# Patient Record
Sex: Female | Born: 1940 | Race: Black or African American | Hispanic: No | Marital: Married | Smoking: Never smoker
Health system: Southern US, Community
[De-identification: ages and names within clinical notes are randomized; demographics above are authoritative.]

## PROBLEM LIST (undated history)

## (undated) DIAGNOSIS — E119 Type 2 diabetes mellitus without complications: Secondary | ICD-10-CM

## (undated) DIAGNOSIS — I10 Essential (primary) hypertension: Secondary | ICD-10-CM

## (undated) HISTORY — PX: NO PAST SURGERIES: SHX2092

---

## 1999-01-03 ENCOUNTER — Other Ambulatory Visit: Admission: RE | Admit: 1999-01-03 | Discharge: 1999-01-03 | Payer: Self-pay | Admitting: Rheumatology

## 1999-01-24 ENCOUNTER — Other Ambulatory Visit: Admission: RE | Admit: 1999-01-24 | Discharge: 1999-01-24 | Payer: Self-pay | Admitting: Rheumatology

## 2000-11-15 ENCOUNTER — Other Ambulatory Visit: Admission: RE | Admit: 2000-11-15 | Discharge: 2000-11-15 | Payer: Self-pay | Admitting: Rheumatology

## 2002-01-17 ENCOUNTER — Other Ambulatory Visit: Admission: RE | Admit: 2002-01-17 | Discharge: 2002-01-17 | Payer: Self-pay | Admitting: Rheumatology

## 2002-08-17 ENCOUNTER — Encounter: Payer: Self-pay | Admitting: Emergency Medicine

## 2002-08-17 ENCOUNTER — Encounter: Payer: Self-pay | Admitting: Internal Medicine

## 2002-08-17 ENCOUNTER — Emergency Department (HOSPITAL_COMMUNITY): Admission: EM | Admit: 2002-08-17 | Discharge: 2002-08-17 | Payer: Self-pay | Admitting: Emergency Medicine

## 2003-09-07 ENCOUNTER — Other Ambulatory Visit: Admission: RE | Admit: 2003-09-07 | Discharge: 2003-09-07 | Payer: Self-pay | Admitting: Rheumatology

## 2008-04-06 ENCOUNTER — Other Ambulatory Visit: Admission: RE | Admit: 2008-04-06 | Discharge: 2008-04-06 | Payer: Self-pay | Admitting: Obstetrics and Gynecology

## 2010-06-07 ENCOUNTER — Encounter
Admission: RE | Admit: 2010-06-07 | Discharge: 2010-06-07 | Payer: Self-pay | Source: Home / Self Care | Attending: Internal Medicine | Admitting: Internal Medicine

## 2010-07-03 ENCOUNTER — Encounter: Payer: Self-pay | Admitting: Internal Medicine

## 2010-10-28 NOTE — Consult Note (Signed)
NAME:  Lori Mendez, Lori Mendez                         ACCOUNT NO.:  [id number]   MEDICAL RECORD NO.:  [id number]                   PATIENT TYPE:  EMS   LOCATION:  [facility]                                 FACILITY:  [facility]   PHYSICIAN:  Candyce Churn, M.D.          DATE OF BIRTH:  [birth year]   DATE OF CONSULTATION:  DATE OF DISCHARGE:                                   CONSULTATION   FINDINGS:  1. Left lower abdominal pain times five days.  2. Bilateral enlarged ovaries with right hydrosalpinx with both ovaries     looking abnormal. Uterine fibroids noted as well.  3. Obstipation in the right colon noted.  4. History of hypertension.  5. History of type II diabetes mellitus.  6. Mild to moderate obesity.   HISTORY OF PRESENT ILLNESS:  The patient was in her usual state of health,  feeling well until about five days ago and developed some crampy left flank  pain that radiated to the left lower quadrant. This became relatively  intense last evening and she came to the [facility] emergency room  this morning for evaluation. After a fairly extensive evaluation including  abdominal ultrasound and abdominal CT, the only findings are the above  findings on CT scan and ultrasound.   LABORATORY DATA:  White count 4,800. Hemoglobin 12.4, platelet count  271,000. Differential not performed. Electrolytes revealed sodium of 132,  potassium 3.6, chloride 102, bicarb 31, BUN 15, creatinine 0.9, blood sugar  197. UA was clear with specific gravity of 1.009, pH of 6.0, 0-2 white  cells. Otherwise negative.   DIAGNOSTIC IMPRESSION:  Chest x-ray was normal.   HOSPITAL COURSE:  The patient was actually given some Morphine sulfate at  0830 a.m. for pain and her pain has resolved dramatically. She is still  having some mild crampy pain that is somewhat positional in nature with  sitting up and standing up.   PHYSICAL EXAMINATION:  VITAL SIGNS: Blood pressure initially of 190/92 but  it came  down to 128/72. Pulse 68 and regular. Respiratory rate 20.  Temperature 97 orally. O2 sat is 97%.  HEENT: Benign examination.  NECK: Supple. No jugular venous distention.  CHEST: Clear to auscultation and percussion.  CARDIAC: Regular rate and rhythm.  No murmur, rub, or gallop.  ABDOMEN: Soft but mildly distended. Bowel sounds are present and slightly  decreased. There is no rebound tenderness. She had some mild tenderness in  the left lower quadrant to palpation.   ASSESSMENT:  Left lower quadrant pain which may or may not be related to her  left ovarian enlargement. She also has a right hydrosalpinx which apparently  is asymptomatic. She is obstipated in her right colon but the transverse  colon and descending colon, other than having non-specific bowel gas, are  normal.   PLAN:  This case was discussed with Dr. Katherine Roan who agrees that her  pain could be  emanating from the ovarian changes and agrees to see her in  his office tomorrow. We will discharge her on Darvocet 1-2 every four to six  hours as needed and for any nausea, Phenergan 12.5 mg every six hours as  needed. Any acute decline in her functional ability, she should contact us  immediately. I have recommended a bland diet and to hydrate herself  carefully. She should not take large boluses of fluids at any one time. She  should resume her usual medications as an outpatient.   MEDICATIONS:  Include Glucotrol XL, one baby aspirin daily, and two anti-  hypertensive medications that she does not have with her, that she does not  recall the names of.                                               Candyce Churn, M.D.    RNG/MEDQ  D:  08/17/2002  T:  08/17/2002  Job:  [id number]   cc:   Demetria Pore. Coral Spikes, M.D.  [address]  [address]  [place]  [place] [address]  Fax: [phone]   S. Kyra Manges, M.D.  [phone] [address]  [place]  [place] [address]  Fax: [phone]

## 2011-09-25 DIAGNOSIS — E785 Hyperlipidemia, unspecified: Secondary | ICD-10-CM | POA: Diagnosis not present

## 2011-09-25 DIAGNOSIS — I10 Essential (primary) hypertension: Secondary | ICD-10-CM | POA: Diagnosis not present

## 2011-09-25 DIAGNOSIS — R05 Cough: Secondary | ICD-10-CM | POA: Diagnosis not present

## 2012-01-22 DIAGNOSIS — I10 Essential (primary) hypertension: Secondary | ICD-10-CM | POA: Diagnosis not present

## 2012-01-22 DIAGNOSIS — E785 Hyperlipidemia, unspecified: Secondary | ICD-10-CM | POA: Diagnosis not present

## 2012-05-20 DIAGNOSIS — E785 Hyperlipidemia, unspecified: Secondary | ICD-10-CM | POA: Diagnosis not present

## 2012-05-20 DIAGNOSIS — I10 Essential (primary) hypertension: Secondary | ICD-10-CM | POA: Diagnosis not present

## 2012-05-20 DIAGNOSIS — E119 Type 2 diabetes mellitus without complications: Secondary | ICD-10-CM | POA: Diagnosis not present

## 2012-09-26 DIAGNOSIS — I10 Essential (primary) hypertension: Secondary | ICD-10-CM | POA: Diagnosis not present

## 2012-09-26 DIAGNOSIS — E1129 Type 2 diabetes mellitus with other diabetic kidney complication: Secondary | ICD-10-CM | POA: Diagnosis not present

## 2012-09-26 DIAGNOSIS — E663 Overweight: Secondary | ICD-10-CM | POA: Diagnosis not present

## 2012-09-26 DIAGNOSIS — E782 Mixed hyperlipidemia: Secondary | ICD-10-CM | POA: Diagnosis not present

## 2012-09-26 DIAGNOSIS — R809 Proteinuria, unspecified: Secondary | ICD-10-CM | POA: Diagnosis not present

## 2012-12-02 DIAGNOSIS — E1139 Type 2 diabetes mellitus with other diabetic ophthalmic complication: Secondary | ICD-10-CM | POA: Diagnosis not present

## 2012-12-02 DIAGNOSIS — E11329 Type 2 diabetes mellitus with mild nonproliferative diabetic retinopathy without macular edema: Secondary | ICD-10-CM | POA: Diagnosis not present

## 2012-12-02 DIAGNOSIS — H2510 Age-related nuclear cataract, unspecified eye: Secondary | ICD-10-CM | POA: Diagnosis not present

## 2013-01-31 DIAGNOSIS — I10 Essential (primary) hypertension: Secondary | ICD-10-CM | POA: Diagnosis not present

## 2013-01-31 DIAGNOSIS — E785 Hyperlipidemia, unspecified: Secondary | ICD-10-CM | POA: Diagnosis not present

## 2013-01-31 DIAGNOSIS — Z0000 Encounter for general adult medical examination without abnormal findings: Secondary | ICD-10-CM | POA: Diagnosis not present

## 2013-01-31 DIAGNOSIS — E1139 Type 2 diabetes mellitus with other diabetic ophthalmic complication: Secondary | ICD-10-CM | POA: Diagnosis not present

## 2013-01-31 DIAGNOSIS — E1339 Other specified diabetes mellitus with other diabetic ophthalmic complication: Secondary | ICD-10-CM | POA: Diagnosis not present

## 2013-03-26 DIAGNOSIS — Z23 Encounter for immunization: Secondary | ICD-10-CM | POA: Diagnosis not present

## 2013-05-02 ENCOUNTER — Other Ambulatory Visit: Payer: Self-pay

## 2013-05-02 DIAGNOSIS — Z1231 Encounter for screening mammogram for malignant neoplasm of breast: Secondary | ICD-10-CM

## 2013-06-03 ENCOUNTER — Ambulatory Visit
Admission: RE | Admit: 2013-06-03 | Discharge: 2013-06-03 | Disposition: A | Discharge status: Home / Self Care | Payer: BC Managed Care – PPO | Source: Ambulatory Visit

## 2013-06-03 DIAGNOSIS — Z1231 Encounter for screening mammogram for malignant neoplasm of breast: Secondary | ICD-10-CM

## 2013-06-10 DIAGNOSIS — E785 Hyperlipidemia, unspecified: Secondary | ICD-10-CM | POA: Diagnosis not present

## 2013-06-10 DIAGNOSIS — J069 Acute upper respiratory infection, unspecified: Secondary | ICD-10-CM | POA: Diagnosis not present

## 2013-06-10 DIAGNOSIS — E119 Type 2 diabetes mellitus without complications: Secondary | ICD-10-CM | POA: Diagnosis not present

## 2013-06-10 DIAGNOSIS — I10 Essential (primary) hypertension: Secondary | ICD-10-CM | POA: Diagnosis not present

## 2013-06-16 DIAGNOSIS — H4011X0 Primary open-angle glaucoma, stage unspecified: Secondary | ICD-10-CM | POA: Diagnosis not present

## 2013-06-16 DIAGNOSIS — E119 Type 2 diabetes mellitus without complications: Secondary | ICD-10-CM | POA: Diagnosis not present

## 2013-06-16 DIAGNOSIS — H2510 Age-related nuclear cataract, unspecified eye: Secondary | ICD-10-CM | POA: Diagnosis not present

## 2013-06-16 DIAGNOSIS — H409 Unspecified glaucoma: Secondary | ICD-10-CM | POA: Diagnosis not present

## 2013-07-30 DIAGNOSIS — H40019 Open angle with borderline findings, low risk, unspecified eye: Secondary | ICD-10-CM | POA: Diagnosis not present

## 2013-09-17 DIAGNOSIS — E1139 Type 2 diabetes mellitus with other diabetic ophthalmic complication: Secondary | ICD-10-CM | POA: Diagnosis not present

## 2013-09-17 DIAGNOSIS — E1339 Other specified diabetes mellitus with other diabetic ophthalmic complication: Secondary | ICD-10-CM | POA: Diagnosis not present

## 2013-09-17 DIAGNOSIS — IMO0001 Reserved for inherently not codable concepts without codable children: Secondary | ICD-10-CM | POA: Diagnosis not present

## 2013-09-17 DIAGNOSIS — I10 Essential (primary) hypertension: Secondary | ICD-10-CM | POA: Diagnosis not present

## 2013-09-17 DIAGNOSIS — E782 Mixed hyperlipidemia: Secondary | ICD-10-CM | POA: Diagnosis not present

## 2013-09-18 DIAGNOSIS — H40019 Open angle with borderline findings, low risk, unspecified eye: Secondary | ICD-10-CM | POA: Diagnosis not present

## 2013-12-03 DIAGNOSIS — H4011X0 Primary open-angle glaucoma, stage unspecified: Secondary | ICD-10-CM | POA: Diagnosis not present

## 2013-12-03 DIAGNOSIS — H409 Unspecified glaucoma: Secondary | ICD-10-CM | POA: Diagnosis not present

## 2013-12-03 DIAGNOSIS — H2510 Age-related nuclear cataract, unspecified eye: Secondary | ICD-10-CM | POA: Diagnosis not present

## 2013-12-08 DIAGNOSIS — H409 Unspecified glaucoma: Secondary | ICD-10-CM | POA: Diagnosis not present

## 2013-12-08 DIAGNOSIS — H4011X0 Primary open-angle glaucoma, stage unspecified: Secondary | ICD-10-CM | POA: Diagnosis not present

## 2014-02-02 DIAGNOSIS — Z0000 Encounter for general adult medical examination without abnormal findings: Secondary | ICD-10-CM | POA: Diagnosis not present

## 2014-02-02 DIAGNOSIS — E785 Hyperlipidemia, unspecified: Secondary | ICD-10-CM | POA: Diagnosis not present

## 2014-02-02 DIAGNOSIS — Z23 Encounter for immunization: Secondary | ICD-10-CM | POA: Diagnosis not present

## 2014-02-02 DIAGNOSIS — E663 Overweight: Secondary | ICD-10-CM | POA: Diagnosis not present

## 2014-02-02 DIAGNOSIS — I10 Essential (primary) hypertension: Secondary | ICD-10-CM | POA: Diagnosis not present

## 2014-02-02 DIAGNOSIS — Z6837 Body mass index (BMI) 37.0-37.9, adult: Secondary | ICD-10-CM | POA: Diagnosis not present

## 2014-02-02 DIAGNOSIS — IMO0001 Reserved for inherently not codable concepts without codable children: Secondary | ICD-10-CM | POA: Diagnosis not present

## 2014-06-03 DIAGNOSIS — I10 Essential (primary) hypertension: Secondary | ICD-10-CM | POA: Diagnosis not present

## 2014-06-03 DIAGNOSIS — E11319 Type 2 diabetes mellitus with unspecified diabetic retinopathy without macular edema: Secondary | ICD-10-CM | POA: Diagnosis not present

## 2014-06-03 DIAGNOSIS — E785 Hyperlipidemia, unspecified: Secondary | ICD-10-CM | POA: Diagnosis not present

## 2014-06-03 DIAGNOSIS — R809 Proteinuria, unspecified: Secondary | ICD-10-CM | POA: Diagnosis not present

## 2014-06-03 DIAGNOSIS — Z6838 Body mass index (BMI) 38.0-38.9, adult: Secondary | ICD-10-CM | POA: Diagnosis not present

## 2014-06-03 DIAGNOSIS — E663 Overweight: Secondary | ICD-10-CM | POA: Diagnosis not present

## 2014-06-03 DIAGNOSIS — E1329 Other specified diabetes mellitus with other diabetic kidney complication: Secondary | ICD-10-CM | POA: Diagnosis not present

## 2014-06-11 ENCOUNTER — Other Ambulatory Visit: Payer: Self-pay | Admitting: Gastroenterology

## 2014-09-07 ENCOUNTER — Ambulatory Visit (HOSPITAL_COMMUNITY): Admission: RE | Admit: 2014-09-07 | Payer: Medicare Other | Source: Ambulatory Visit | Admitting: Gastroenterology

## 2014-09-07 ENCOUNTER — Encounter (HOSPITAL_COMMUNITY): Admission: RE | Payer: Self-pay | Source: Ambulatory Visit

## 2014-09-07 SURGERY — COLONOSCOPY WITH PROPOFOL
Anesthesia: Monitor Anesthesia Care

## 2014-09-08 DIAGNOSIS — H2513 Age-related nuclear cataract, bilateral: Secondary | ICD-10-CM | POA: Diagnosis not present

## 2014-09-08 DIAGNOSIS — H4011X3 Primary open-angle glaucoma, severe stage: Secondary | ICD-10-CM | POA: Diagnosis not present

## 2014-09-30 DIAGNOSIS — R51 Headache: Secondary | ICD-10-CM | POA: Diagnosis not present

## 2014-09-30 DIAGNOSIS — E785 Hyperlipidemia, unspecified: Secondary | ICD-10-CM | POA: Diagnosis not present

## 2014-09-30 DIAGNOSIS — E11319 Type 2 diabetes mellitus with unspecified diabetic retinopathy without macular edema: Secondary | ICD-10-CM | POA: Diagnosis not present

## 2014-09-30 DIAGNOSIS — I10 Essential (primary) hypertension: Secondary | ICD-10-CM | POA: Diagnosis not present

## 2014-12-24 DIAGNOSIS — H2513 Age-related nuclear cataract, bilateral: Secondary | ICD-10-CM | POA: Diagnosis not present

## 2014-12-24 DIAGNOSIS — H4011X3 Primary open-angle glaucoma, severe stage: Secondary | ICD-10-CM | POA: Diagnosis not present

## 2015-01-18 DIAGNOSIS — H4011X3 Primary open-angle glaucoma, severe stage: Secondary | ICD-10-CM | POA: Diagnosis not present

## 2015-01-18 DIAGNOSIS — H2513 Age-related nuclear cataract, bilateral: Secondary | ICD-10-CM | POA: Diagnosis not present

## 2015-02-19 DIAGNOSIS — I10 Essential (primary) hypertension: Secondary | ICD-10-CM | POA: Diagnosis not present

## 2015-02-19 DIAGNOSIS — E11319 Type 2 diabetes mellitus with unspecified diabetic retinopathy without macular edema: Secondary | ICD-10-CM | POA: Diagnosis not present

## 2015-02-19 DIAGNOSIS — E785 Hyperlipidemia, unspecified: Secondary | ICD-10-CM | POA: Diagnosis not present

## 2015-02-19 DIAGNOSIS — E663 Overweight: Secondary | ICD-10-CM | POA: Diagnosis not present

## 2015-02-19 DIAGNOSIS — Z0000 Encounter for general adult medical examination without abnormal findings: Secondary | ICD-10-CM | POA: Diagnosis not present

## 2015-02-19 DIAGNOSIS — Z6839 Body mass index (BMI) 39.0-39.9, adult: Secondary | ICD-10-CM | POA: Diagnosis not present

## 2015-07-01 DIAGNOSIS — Z6839 Body mass index (BMI) 39.0-39.9, adult: Secondary | ICD-10-CM | POA: Diagnosis not present

## 2015-07-01 DIAGNOSIS — E785 Hyperlipidemia, unspecified: Secondary | ICD-10-CM | POA: Diagnosis not present

## 2015-07-01 DIAGNOSIS — I10 Essential (primary) hypertension: Secondary | ICD-10-CM | POA: Diagnosis not present

## 2015-07-01 DIAGNOSIS — E663 Overweight: Secondary | ICD-10-CM | POA: Diagnosis not present

## 2015-07-01 DIAGNOSIS — K219 Gastro-esophageal reflux disease without esophagitis: Secondary | ICD-10-CM | POA: Diagnosis not present

## 2015-07-01 DIAGNOSIS — E11319 Type 2 diabetes mellitus with unspecified diabetic retinopathy without macular edema: Secondary | ICD-10-CM | POA: Diagnosis not present

## 2015-07-01 DIAGNOSIS — Z7984 Long term (current) use of oral hypoglycemic drugs: Secondary | ICD-10-CM | POA: Diagnosis not present

## 2015-09-14 ENCOUNTER — Other Ambulatory Visit: Payer: Self-pay | Admitting: Gastroenterology

## 2015-09-21 ENCOUNTER — Encounter (HOSPITAL_COMMUNITY): Payer: Self-pay

## 2015-09-21 ENCOUNTER — Ambulatory Visit (HOSPITAL_COMMUNITY): Admit: 2015-09-21 | Payer: Self-pay | Admitting: Gastroenterology

## 2015-09-21 SURGERY — COLONOSCOPY WITH PROPOFOL
Anesthesia: Monitor Anesthesia Care

## 2015-10-28 DIAGNOSIS — Z6838 Body mass index (BMI) 38.0-38.9, adult: Secondary | ICD-10-CM | POA: Diagnosis not present

## 2015-10-28 DIAGNOSIS — Z7984 Long term (current) use of oral hypoglycemic drugs: Secondary | ICD-10-CM | POA: Diagnosis not present

## 2015-10-28 DIAGNOSIS — E11319 Type 2 diabetes mellitus with unspecified diabetic retinopathy without macular edema: Secondary | ICD-10-CM | POA: Diagnosis not present

## 2015-10-28 DIAGNOSIS — E785 Hyperlipidemia, unspecified: Secondary | ICD-10-CM | POA: Diagnosis not present

## 2015-10-28 DIAGNOSIS — E663 Overweight: Secondary | ICD-10-CM | POA: Diagnosis not present

## 2015-10-28 DIAGNOSIS — Z1231 Encounter for screening mammogram for malignant neoplasm of breast: Secondary | ICD-10-CM | POA: Diagnosis not present

## 2015-10-28 DIAGNOSIS — I10 Essential (primary) hypertension: Secondary | ICD-10-CM | POA: Diagnosis not present

## 2015-10-28 DIAGNOSIS — R600 Localized edema: Secondary | ICD-10-CM | POA: Diagnosis not present

## 2015-11-16 ENCOUNTER — Other Ambulatory Visit: Payer: Self-pay | Admitting: Internal Medicine

## 2015-11-16 DIAGNOSIS — Z1231 Encounter for screening mammogram for malignant neoplasm of breast: Secondary | ICD-10-CM

## 2015-11-24 ENCOUNTER — Other Ambulatory Visit: Payer: Self-pay | Admitting: Gastroenterology

## 2015-12-15 ENCOUNTER — Ambulatory Visit
Admission: RE | Admit: 2015-12-15 | Discharge: 2015-12-15 | Disposition: A | Discharge status: Home / Self Care | Payer: BC Managed Care – PPO | Source: Ambulatory Visit | Attending: Internal Medicine | Admitting: Internal Medicine

## 2015-12-15 DIAGNOSIS — Z1231 Encounter for screening mammogram for malignant neoplasm of breast: Secondary | ICD-10-CM

## 2015-12-21 ENCOUNTER — Encounter (HOSPITAL_COMMUNITY): Payer: Self-pay | Admitting: *Deleted

## 2015-12-28 ENCOUNTER — Encounter (HOSPITAL_COMMUNITY): Payer: Self-pay | Admitting: *Deleted

## 2015-12-28 ENCOUNTER — Encounter (HOSPITAL_COMMUNITY)
Admission: RE | Disposition: A | Discharge status: Home / Self Care | Payer: Self-pay | Source: Ambulatory Visit | Attending: Gastroenterology

## 2015-12-28 ENCOUNTER — Ambulatory Visit (HOSPITAL_COMMUNITY)
Admission: RE | Admit: 2015-12-28 | Discharge: 2015-12-28 | Disposition: A | Discharge status: Home / Self Care | Payer: BC Managed Care – PPO | Source: Ambulatory Visit | Attending: Gastroenterology | Admitting: Gastroenterology

## 2015-12-28 ENCOUNTER — Ambulatory Visit (HOSPITAL_COMMUNITY): Payer: BC Managed Care – PPO | Admitting: Certified Registered Nurse Anesthetist

## 2015-12-28 DIAGNOSIS — D124 Benign neoplasm of descending colon: Secondary | ICD-10-CM | POA: Diagnosis not present

## 2015-12-28 DIAGNOSIS — E119 Type 2 diabetes mellitus without complications: Secondary | ICD-10-CM | POA: Diagnosis not present

## 2015-12-28 DIAGNOSIS — I10 Essential (primary) hypertension: Secondary | ICD-10-CM | POA: Diagnosis not present

## 2015-12-28 DIAGNOSIS — Z7984 Long term (current) use of oral hypoglycemic drugs: Secondary | ICD-10-CM | POA: Insufficient documentation

## 2015-12-28 DIAGNOSIS — Z79899 Other long term (current) drug therapy: Secondary | ICD-10-CM | POA: Insufficient documentation

## 2015-12-28 DIAGNOSIS — Z6838 Body mass index (BMI) 38.0-38.9, adult: Secondary | ICD-10-CM | POA: Insufficient documentation

## 2015-12-28 DIAGNOSIS — Z7982 Long term (current) use of aspirin: Secondary | ICD-10-CM | POA: Insufficient documentation

## 2015-12-28 DIAGNOSIS — E7800 Pure hypercholesterolemia, unspecified: Secondary | ICD-10-CM | POA: Insufficient documentation

## 2015-12-28 DIAGNOSIS — Z1211 Encounter for screening for malignant neoplasm of colon: Secondary | ICD-10-CM | POA: Insufficient documentation

## 2015-12-28 DIAGNOSIS — K635 Polyp of colon: Secondary | ICD-10-CM | POA: Diagnosis not present

## 2015-12-28 HISTORY — DX: Type 2 diabetes mellitus without complications: E11.9

## 2015-12-28 HISTORY — DX: Essential (primary) hypertension: I10

## 2015-12-28 HISTORY — PX: COLONOSCOPY WITH PROPOFOL: SHX5780

## 2015-12-28 LAB — GLUCOSE, CAPILLARY: GLUCOSE-CAPILLARY: 103 mg/dL — AB (ref 65–99)

## 2015-12-28 SURGERY — COLONOSCOPY WITH PROPOFOL
Anesthesia: Monitor Anesthesia Care

## 2015-12-28 MED ORDER — ONDANSETRON HCL 4 MG/2ML IJ SOLN
INTRAMUSCULAR | Status: AC
  Filled 2015-12-28: qty 2

## 2015-12-28 MED ORDER — LIDOCAINE HCL (CARDIAC) 20 MG/ML IV SOLN
INTRAVENOUS | Status: AC
  Filled 2015-12-28: qty 5

## 2015-12-28 MED ORDER — SODIUM CHLORIDE 0.9 % IV SOLN: INTRAVENOUS | Status: DC

## 2015-12-28 MED ORDER — LACTATED RINGERS IV SOLN
INTRAVENOUS | Status: DC
  Administered 2015-12-28: 11:00:00 via INTRAVENOUS

## 2015-12-28 MED ORDER — LABETALOL HCL 5 MG/ML IV SOLN
INTRAVENOUS | Status: AC
  Filled 2015-12-28: qty 4

## 2015-12-28 MED ORDER — LABETALOL HCL 5 MG/ML IV SOLN
INTRAVENOUS | Status: DC | PRN
  Administered 2015-12-28 (×3): 2.5 mg via INTRAVENOUS

## 2015-12-28 MED ORDER — PROPOFOL 10 MG/ML IV BOLUS
INTRAVENOUS | Status: AC
  Filled 2015-12-28: qty 40

## 2015-12-28 MED ORDER — PROPOFOL 500 MG/50ML IV EMUL
INTRAVENOUS | Status: DC | PRN
  Administered 2015-12-28: 50 ug/kg/min via INTRAVENOUS

## 2015-12-28 MED ORDER — ONDANSETRON HCL 4 MG/2ML IJ SOLN
INTRAMUSCULAR | Status: DC | PRN
  Administered 2015-12-28: 4 mg via INTRAVENOUS

## 2015-12-28 SURGICAL SUPPLY — 21 items

## 2015-12-28 NOTE — Anesthesia Procedure Notes (Signed)
Procedure Name: MAC Date/Time: 12/28/2015 11:31 AM Performed by: West Pugh Pre-anesthesia Checklist: Patient identified, Timeout performed, Emergency Drugs available, Suction available and Patient being monitored Patient Re-evaluated:Patient Re-evaluated prior to inductionOxygen Delivery Method: Simple face mask Dental Injury: Teeth and Oropharynx as per pre-operative assessment

## 2015-12-28 NOTE — Anesthesia Postprocedure Evaluation (Signed)
Anesthesia Post Note  Patient: Lori Mendez  Procedure(s) Performed: Procedure(s) (LRB): COLONOSCOPY WITH PROPOFOL (N/A)  Patient location during evaluation: Endoscopy Anesthesia Type: MAC Level of consciousness: awake and alert, oriented and patient cooperative Pain management: pain level controlled Vital Signs Assessment: post-procedure vital signs reviewed and stable Respiratory status: spontaneous breathing, nonlabored ventilation and respiratory function stable Cardiovascular status: blood pressure returned to baseline and stable Postop Assessment: no signs of nausea or vomiting Anesthetic complications: no    Last Vitals:  Filed Vitals:   12/28/15 1117 12/28/15 1208  BP: 227/89 156/125  Pulse:  56  Temp:    Resp:  20    Last Pain: There were no vitals filed for this visit.               Midge Minium

## 2015-12-28 NOTE — Op Note (Signed)
[facility] Patient Name: Lori Mendez Procedure Date: 12/28/2015 MRN: [id number] Attending MD: Garlan Fair , MD Date of Birth: May 13, 1941 CSN: [id number] Age: 75 Admit Type: Outpatient Procedure:                Colonoscopy Indications:              Screening for colorectal malignant neoplasm Providers:                Garlan Fair, MD, Cleda Daub, RN, Corliss Parish, Technician Referring MD:              Medicines:                Propofol per Anesthesia Complications:            No immediate complications. Estimated Blood Loss:     Estimated blood loss: none. Procedure:                Pre-Anesthesia Assessment:                           - Prior to the procedure, a History and Physical                            was performed, and patient medications and                            allergies were reviewed. The patient's tolerance of                            previous anesthesia was also reviewed. The risks                            and benefits of the procedure and the sedation                            options and risks were discussed with the patient.                            All questions were answered, and informed consent                            was obtained. Prior Anticoagulants: The patient has                            taken aspirin, last dose was 1 day prior to                            procedure. ASA Grade Assessment: II - A patient                            with mild systemic disease. After reviewing the  risks and benefits, the patient was deemed in                            satisfactory condition to undergo the procedure.                           After obtaining informed consent, the colonoscope                            was passed under direct vision. Throughout the                            procedure, the patient's blood pressure, pulse, and   oxygen saturations were monitored continuously. The                            [id number]) scope was introduced through                            the anus and advanced to the the cecum, identified                            by appendiceal orifice and ileocecal valve. The                            colonoscopy was technically difficult and complex                            due to significant looping. The patient tolerated                            the procedure well. The quality of the bowel                            preparation was good. The ileocecal valve, the                            appendiceal orifice and the rectum were                            photographed. Scope In: 11:35:41 AM Scope Out: 11:59:24 AM Scope Withdrawal Time: 0 hours 8 minutes 15 seconds  Total Procedure Duration: 0 hours 23 minutes 43 seconds  Findings:      The perianal and digital rectal examinations were normal.      A 3 mm polyp was found in the proximal descending colon. The polyp was       sessile. The polyp was removed with a cold biopsy forceps. Resection and       retrieval were complete.      The exam was otherwise without abnormality. Impression:               - One 3 mm polyp in the proximal descending colon,  removed with a cold biopsy forceps. Resected and                            retrieved.                           - The examination was otherwise normal. Moderate Sedation:      N/A- Per Anesthesia Care Recommendation:           - Patient has a contact number available for                            emergencies. The signs and symptoms of potential                            delayed complications were discussed with the                            patient. Return to normal activities tomorrow.                            Written discharge instructions were provided to the                            patient.                           - Repeat colonoscopy  date to be determined after                            pending pathology results are reviewed for                            surveillance.                           - Resume previous diet.                           - Continue present medications. Procedure Code(s):        --- Professional ---                           [phone], Colonoscopy, flexible; with biopsy, single                            or multiple Diagnosis Code(s):        --- Professional ---                           Z12.11, Encounter for screening for malignant                            neoplasm of colon                           D12.4, Benign neoplasm of descending colon CPT copyright 2016 American Medical  Association. All rights reserved. The codes documented in this report are preliminary and upon coder review may  be revised to meet current compliance requirements. Earle Gell, MD Garlan Fair, MD 12/28/2015 12:06:04 PM This report has been signed electronically. Number of Addenda: 0

## 2015-12-28 NOTE — H&P (Signed)
  Procedure: Baseline screening colonoscopy. No family history of colon cancer.  History: The patient is a 75 year old female born [birth year]. She is scheduled to undergo her first screening colonoscopy.  Past medical history: Type 2 diabetes mellitus. Hypertension. Hypercholesterolemia. Uterine fibroids. Gout.  Medication allergies: None  Exam: The patient is alert and lying comfortably on the endoscopy stretcher. Abdomen is soft and nontender to palpation. Cardiac exam reveals a regular rhythm. Lungs are clear to auscultation.  Plan: Proceed with baseline screening colonoscopy

## 2015-12-28 NOTE — Anesthesia Preprocedure Evaluation (Addendum)
Anesthesia Evaluation  Patient identified by MRN, date of birth, ID band Patient awake    Reviewed: Allergy & Precautions, NPO status , Patient's Chart, lab work & pertinent test results  History of Anesthesia Complications Negative for: history of anesthetic complications  Airway Mallampati: I  TM Distance: >3 FB Neck ROM: Full    Dental  (+) Edentulous Upper, Edentulous Lower   Pulmonary neg pulmonary ROS,    breath sounds clear to auscultation       Cardiovascular hypertension, Pt. on medications (-) angina Rhythm:Regular Rate:Normal     Neuro/Psych negative neurological ROS     GI/Hepatic negative GI ROS, Neg liver ROS,   Endo/Other  diabetes (glu 103), Oral Hypoglycemic AgentsMorbid obesity  Renal/GU negative Renal ROS     Musculoskeletal   Abdominal (+) + obese,   Peds  Hematology negative hematology ROS (+)   Anesthesia Other Findings   Reproductive/Obstetrics                            Anesthesia Physical Anesthesia Plan  ASA: III  Anesthesia Plan: MAC   Post-op Pain Management:    Induction: Intravenous  Airway Management Planned: Natural Airway  Additional Equipment:   Intra-op Plan:   Post-operative Plan:   Informed Consent: I have reviewed the patients History and Physical, chart, labs and discussed the procedure including the risks, benefits and alternatives for the proposed anesthesia with the patient or authorized representative who has indicated his/her understanding and acceptance.     Plan Discussed with: CRNA and Surgeon  Anesthesia Plan Comments: (Plan routine monitors, MAC)        Anesthesia Quick Evaluation

## 2015-12-28 NOTE — Transfer of Care (Signed)
Immediate Anesthesia Transfer of Care Note  Patient: Lori Mendez  Procedure(s) Performed: Procedure(s): COLONOSCOPY WITH PROPOFOL (N/A)  Patient Location: PACU  Anesthesia Type:MAC  Level of Consciousness:  sedated, patient cooperative and responds to stimulation  Airway & Oxygen Therapy:Patient Spontanous Breathing and Patient connected to face mask oxgen  Post-op Assessment:  Report given to PACU RN and Post -op Vital signs reviewed and stable  Post vital signs:  Reviewed and stable  Last Vitals:  Filed Vitals:   12/28/15 1117 12/28/15 1208  BP: 227/89 156/125  Pulse:  56  Temp:    Resp:  20    Complications: No apparent anesthesia complications

## 2015-12-28 NOTE — Discharge Instructions (Signed)

## 2015-12-29 ENCOUNTER — Encounter (HOSPITAL_COMMUNITY): Payer: Self-pay | Admitting: Gastroenterology

## 2016-02-28 DIAGNOSIS — Z23 Encounter for immunization: Secondary | ICD-10-CM | POA: Diagnosis not present

## 2016-02-28 DIAGNOSIS — Z7984 Long term (current) use of oral hypoglycemic drugs: Secondary | ICD-10-CM | POA: Diagnosis not present

## 2016-02-28 DIAGNOSIS — Z0000 Encounter for general adult medical examination without abnormal findings: Secondary | ICD-10-CM | POA: Diagnosis not present

## 2016-02-28 DIAGNOSIS — Z6839 Body mass index (BMI) 39.0-39.9, adult: Secondary | ICD-10-CM | POA: Diagnosis not present

## 2016-02-28 DIAGNOSIS — E663 Overweight: Secondary | ICD-10-CM | POA: Diagnosis not present

## 2016-02-28 DIAGNOSIS — E11319 Type 2 diabetes mellitus with unspecified diabetic retinopathy without macular edema: Secondary | ICD-10-CM | POA: Diagnosis not present

## 2016-02-28 DIAGNOSIS — E7800 Pure hypercholesterolemia, unspecified: Secondary | ICD-10-CM | POA: Diagnosis not present

## 2016-02-28 DIAGNOSIS — I10 Essential (primary) hypertension: Secondary | ICD-10-CM | POA: Diagnosis not present

## 2016-09-13 DIAGNOSIS — E1329 Other specified diabetes mellitus with other diabetic kidney complication: Secondary | ICD-10-CM | POA: Diagnosis not present

## 2016-09-13 DIAGNOSIS — E11319 Type 2 diabetes mellitus with unspecified diabetic retinopathy without macular edema: Secondary | ICD-10-CM | POA: Diagnosis not present

## 2016-09-13 DIAGNOSIS — E7800 Pure hypercholesterolemia, unspecified: Secondary | ICD-10-CM | POA: Diagnosis not present

## 2016-09-13 DIAGNOSIS — I10 Essential (primary) hypertension: Secondary | ICD-10-CM | POA: Diagnosis not present

## 2016-09-13 DIAGNOSIS — Z7984 Long term (current) use of oral hypoglycemic drugs: Secondary | ICD-10-CM | POA: Diagnosis not present

## 2016-09-13 DIAGNOSIS — E663 Overweight: Secondary | ICD-10-CM | POA: Diagnosis not present

## 2016-11-29 DIAGNOSIS — H52223 Regular astigmatism, bilateral: Secondary | ICD-10-CM | POA: Diagnosis not present

## 2016-11-29 DIAGNOSIS — H5213 Myopia, bilateral: Secondary | ICD-10-CM | POA: Diagnosis not present

## 2016-11-29 DIAGNOSIS — H2513 Age-related nuclear cataract, bilateral: Secondary | ICD-10-CM | POA: Diagnosis not present

## 2016-11-29 DIAGNOSIS — H524 Presbyopia: Secondary | ICD-10-CM | POA: Diagnosis not present

## 2016-11-29 DIAGNOSIS — H401134 Primary open-angle glaucoma, bilateral, indeterminate stage: Secondary | ICD-10-CM | POA: Diagnosis not present

## 2016-12-06 DIAGNOSIS — H2513 Age-related nuclear cataract, bilateral: Secondary | ICD-10-CM | POA: Diagnosis not present

## 2016-12-06 DIAGNOSIS — H04123 Dry eye syndrome of bilateral lacrimal glands: Secondary | ICD-10-CM | POA: Diagnosis not present

## 2016-12-06 DIAGNOSIS — E113292 Type 2 diabetes mellitus with mild nonproliferative diabetic retinopathy without macular edema, left eye: Secondary | ICD-10-CM | POA: Diagnosis not present

## 2016-12-06 DIAGNOSIS — H35033 Hypertensive retinopathy, bilateral: Secondary | ICD-10-CM | POA: Diagnosis not present

## 2016-12-06 DIAGNOSIS — H401132 Primary open-angle glaucoma, bilateral, moderate stage: Secondary | ICD-10-CM | POA: Diagnosis not present

## 2017-03-07 DIAGNOSIS — I10 Essential (primary) hypertension: Secondary | ICD-10-CM | POA: Diagnosis not present

## 2017-03-07 DIAGNOSIS — Z23 Encounter for immunization: Secondary | ICD-10-CM | POA: Diagnosis not present

## 2017-03-07 DIAGNOSIS — E11319 Type 2 diabetes mellitus with unspecified diabetic retinopathy without macular edema: Secondary | ICD-10-CM | POA: Diagnosis not present

## 2017-03-07 DIAGNOSIS — Z6838 Body mass index (BMI) 38.0-38.9, adult: Secondary | ICD-10-CM | POA: Diagnosis not present

## 2017-03-07 DIAGNOSIS — Z0000 Encounter for general adult medical examination without abnormal findings: Secondary | ICD-10-CM | POA: Diagnosis not present

## 2017-03-07 DIAGNOSIS — E663 Overweight: Secondary | ICD-10-CM | POA: Diagnosis not present

## 2017-03-07 DIAGNOSIS — E7800 Pure hypercholesterolemia, unspecified: Secondary | ICD-10-CM | POA: Diagnosis not present

## 2017-06-13 DIAGNOSIS — H401132 Primary open-angle glaucoma, bilateral, moderate stage: Secondary | ICD-10-CM | POA: Diagnosis not present

## 2017-06-13 DIAGNOSIS — H04123 Dry eye syndrome of bilateral lacrimal glands: Secondary | ICD-10-CM | POA: Diagnosis not present

## 2017-06-13 DIAGNOSIS — E113292 Type 2 diabetes mellitus with mild nonproliferative diabetic retinopathy without macular edema, left eye: Secondary | ICD-10-CM | POA: Diagnosis not present

## 2017-06-13 DIAGNOSIS — H2513 Age-related nuclear cataract, bilateral: Secondary | ICD-10-CM | POA: Diagnosis not present

## 2017-07-09 DIAGNOSIS — H04123 Dry eye syndrome of bilateral lacrimal glands: Secondary | ICD-10-CM | POA: Diagnosis not present

## 2017-07-09 DIAGNOSIS — H401132 Primary open-angle glaucoma, bilateral, moderate stage: Secondary | ICD-10-CM | POA: Diagnosis not present

## 2017-07-09 DIAGNOSIS — E113292 Type 2 diabetes mellitus with mild nonproliferative diabetic retinopathy without macular edema, left eye: Secondary | ICD-10-CM | POA: Diagnosis not present

## 2017-07-09 DIAGNOSIS — H2513 Age-related nuclear cataract, bilateral: Secondary | ICD-10-CM | POA: Diagnosis not present

## 2017-09-21 DIAGNOSIS — E11319 Type 2 diabetes mellitus with unspecified diabetic retinopathy without macular edema: Secondary | ICD-10-CM | POA: Diagnosis not present

## 2017-09-21 DIAGNOSIS — E663 Overweight: Secondary | ICD-10-CM | POA: Diagnosis not present

## 2017-09-21 DIAGNOSIS — E7800 Pure hypercholesterolemia, unspecified: Secondary | ICD-10-CM | POA: Diagnosis not present

## 2017-09-21 DIAGNOSIS — I10 Essential (primary) hypertension: Secondary | ICD-10-CM | POA: Diagnosis not present

## 2018-01-08 DIAGNOSIS — E113292 Type 2 diabetes mellitus with mild nonproliferative diabetic retinopathy without macular edema, left eye: Secondary | ICD-10-CM | POA: Diagnosis not present

## 2018-01-08 DIAGNOSIS — H401132 Primary open-angle glaucoma, bilateral, moderate stage: Secondary | ICD-10-CM | POA: Diagnosis not present

## 2018-01-08 DIAGNOSIS — H04123 Dry eye syndrome of bilateral lacrimal glands: Secondary | ICD-10-CM | POA: Diagnosis not present

## 2018-01-08 DIAGNOSIS — H2513 Age-related nuclear cataract, bilateral: Secondary | ICD-10-CM | POA: Diagnosis not present

## 2018-03-20 DIAGNOSIS — E11319 Type 2 diabetes mellitus with unspecified diabetic retinopathy without macular edema: Secondary | ICD-10-CM | POA: Diagnosis not present

## 2018-03-20 DIAGNOSIS — Z23 Encounter for immunization: Secondary | ICD-10-CM | POA: Diagnosis not present

## 2018-03-20 DIAGNOSIS — I10 Essential (primary) hypertension: Secondary | ICD-10-CM | POA: Diagnosis not present

## 2018-03-20 DIAGNOSIS — Z0000 Encounter for general adult medical examination without abnormal findings: Secondary | ICD-10-CM | POA: Diagnosis not present

## 2018-03-20 DIAGNOSIS — E7800 Pure hypercholesterolemia, unspecified: Secondary | ICD-10-CM | POA: Diagnosis not present

## 2018-07-02 DIAGNOSIS — H401132 Primary open-angle glaucoma, bilateral, moderate stage: Secondary | ICD-10-CM | POA: Diagnosis not present

## 2018-07-02 DIAGNOSIS — E113293 Type 2 diabetes mellitus with mild nonproliferative diabetic retinopathy without macular edema, bilateral: Secondary | ICD-10-CM | POA: Diagnosis not present

## 2018-07-02 DIAGNOSIS — H2513 Age-related nuclear cataract, bilateral: Secondary | ICD-10-CM | POA: Diagnosis not present

## 2018-07-02 DIAGNOSIS — H04123 Dry eye syndrome of bilateral lacrimal glands: Secondary | ICD-10-CM | POA: Diagnosis not present

## 2018-07-02 DIAGNOSIS — H35033 Hypertensive retinopathy, bilateral: Secondary | ICD-10-CM | POA: Diagnosis not present

## 2018-08-06 DIAGNOSIS — H401132 Primary open-angle glaucoma, bilateral, moderate stage: Secondary | ICD-10-CM | POA: Diagnosis not present

## 2018-08-06 DIAGNOSIS — H35371 Puckering of macula, right eye: Secondary | ICD-10-CM | POA: Diagnosis not present

## 2018-08-06 DIAGNOSIS — H43812 Vitreous degeneration, left eye: Secondary | ICD-10-CM | POA: Diagnosis not present

## 2018-08-06 DIAGNOSIS — H04123 Dry eye syndrome of bilateral lacrimal glands: Secondary | ICD-10-CM | POA: Diagnosis not present

## 2018-08-06 DIAGNOSIS — H2513 Age-related nuclear cataract, bilateral: Secondary | ICD-10-CM | POA: Diagnosis not present

## 2018-08-19 ENCOUNTER — Other Ambulatory Visit: Payer: Self-pay | Admitting: Internal Medicine

## 2018-08-19 DIAGNOSIS — Z1231 Encounter for screening mammogram for malignant neoplasm of breast: Secondary | ICD-10-CM

## 2018-09-17 ENCOUNTER — Ambulatory Visit: Payer: BC Managed Care – PPO

## 2018-09-25 DIAGNOSIS — Z7984 Long term (current) use of oral hypoglycemic drugs: Secondary | ICD-10-CM | POA: Diagnosis not present

## 2018-09-25 DIAGNOSIS — E7800 Pure hypercholesterolemia, unspecified: Secondary | ICD-10-CM | POA: Diagnosis not present

## 2018-09-25 DIAGNOSIS — E11319 Type 2 diabetes mellitus with unspecified diabetic retinopathy without macular edema: Secondary | ICD-10-CM | POA: Diagnosis not present

## 2018-09-25 DIAGNOSIS — I10 Essential (primary) hypertension: Secondary | ICD-10-CM | POA: Diagnosis not present

## 2018-11-19 ENCOUNTER — Ambulatory Visit: Payer: BC Managed Care – PPO

## 2018-11-25 DIAGNOSIS — H35371 Puckering of macula, right eye: Secondary | ICD-10-CM | POA: Diagnosis not present

## 2018-11-25 DIAGNOSIS — H43813 Vitreous degeneration, bilateral: Secondary | ICD-10-CM | POA: Diagnosis not present

## 2018-11-25 DIAGNOSIS — E113311 Type 2 diabetes mellitus with moderate nonproliferative diabetic retinopathy with macular edema, right eye: Secondary | ICD-10-CM | POA: Diagnosis not present

## 2018-11-25 DIAGNOSIS — E113292 Type 2 diabetes mellitus with mild nonproliferative diabetic retinopathy without macular edema, left eye: Secondary | ICD-10-CM | POA: Diagnosis not present

## 2019-01-01 ENCOUNTER — Ambulatory Visit: Payer: BC Managed Care – PPO

## 2019-03-05 DIAGNOSIS — H2513 Age-related nuclear cataract, bilateral: Secondary | ICD-10-CM | POA: Diagnosis not present

## 2019-03-05 DIAGNOSIS — H43812 Vitreous degeneration, left eye: Secondary | ICD-10-CM | POA: Diagnosis not present

## 2019-03-05 DIAGNOSIS — H401132 Primary open-angle glaucoma, bilateral, moderate stage: Secondary | ICD-10-CM | POA: Diagnosis not present

## 2019-03-05 DIAGNOSIS — H04123 Dry eye syndrome of bilateral lacrimal glands: Secondary | ICD-10-CM | POA: Diagnosis not present

## 2019-03-27 DIAGNOSIS — Z23 Encounter for immunization: Secondary | ICD-10-CM | POA: Diagnosis not present

## 2019-04-15 ENCOUNTER — Other Ambulatory Visit: Payer: Self-pay | Admitting: Internal Medicine

## 2019-04-15 DIAGNOSIS — Z0000 Encounter for general adult medical examination without abnormal findings: Secondary | ICD-10-CM | POA: Diagnosis not present

## 2019-04-15 DIAGNOSIS — E7800 Pure hypercholesterolemia, unspecified: Secondary | ICD-10-CM | POA: Diagnosis not present

## 2019-04-15 DIAGNOSIS — E2839 Other primary ovarian failure: Secondary | ICD-10-CM | POA: Diagnosis not present

## 2019-04-15 DIAGNOSIS — Z1389 Encounter for screening for other disorder: Secondary | ICD-10-CM | POA: Diagnosis not present

## 2019-04-15 DIAGNOSIS — I10 Essential (primary) hypertension: Secondary | ICD-10-CM | POA: Diagnosis not present

## 2019-04-15 DIAGNOSIS — E11319 Type 2 diabetes mellitus with unspecified diabetic retinopathy without macular edema: Secondary | ICD-10-CM | POA: Diagnosis not present

## 2019-05-06 DIAGNOSIS — H43812 Vitreous degeneration, left eye: Secondary | ICD-10-CM | POA: Diagnosis not present

## 2019-05-06 DIAGNOSIS — H2513 Age-related nuclear cataract, bilateral: Secondary | ICD-10-CM | POA: Diagnosis not present

## 2019-05-06 DIAGNOSIS — H401132 Primary open-angle glaucoma, bilateral, moderate stage: Secondary | ICD-10-CM | POA: Diagnosis not present

## 2019-07-09 ENCOUNTER — Other Ambulatory Visit: Payer: BC Managed Care – PPO

## 2019-07-09 ENCOUNTER — Ambulatory Visit: Payer: BC Managed Care – PPO

## 2019-09-16 ENCOUNTER — Ambulatory Visit
Admission: RE | Admit: 2019-09-16 | Discharge: 2019-09-16 | Disposition: A | Discharge status: Home / Self Care | Payer: BC Managed Care – PPO | Source: Ambulatory Visit | Attending: Internal Medicine | Admitting: Internal Medicine

## 2019-09-16 ENCOUNTER — Other Ambulatory Visit: Payer: Self-pay

## 2019-09-16 DIAGNOSIS — E2839 Other primary ovarian failure: Secondary | ICD-10-CM

## 2019-09-16 DIAGNOSIS — Z1231 Encounter for screening mammogram for malignant neoplasm of breast: Secondary | ICD-10-CM

## 2020-02-04 ENCOUNTER — Other Ambulatory Visit: Payer: BC Managed Care – PPO

## 2020-02-04 ENCOUNTER — Other Ambulatory Visit: Payer: Self-pay

## 2020-02-04 DIAGNOSIS — Z20822 Contact with and (suspected) exposure to covid-19: Secondary | ICD-10-CM

## 2020-02-06 LAB — SARS-COV-2, NAA 2 DAY TAT

## 2020-02-06 LAB — NOVEL CORONAVIRUS, NAA: SARS-CoV-2, NAA: NOT DETECTED

## 2020-04-15 IMAGING — MG DIGITAL SCREENING BILAT W/ TOMO W/ CAD
8 series · 8 of 24 positions shown · non-contrast
Comparison: Previous exam(s).

CLINICAL DATA: Screening.

EXAM:
DIGITAL SCREENING BILATERAL MAMMOGRAM WITH TOMO AND CAD

[R MLO synth-2D]
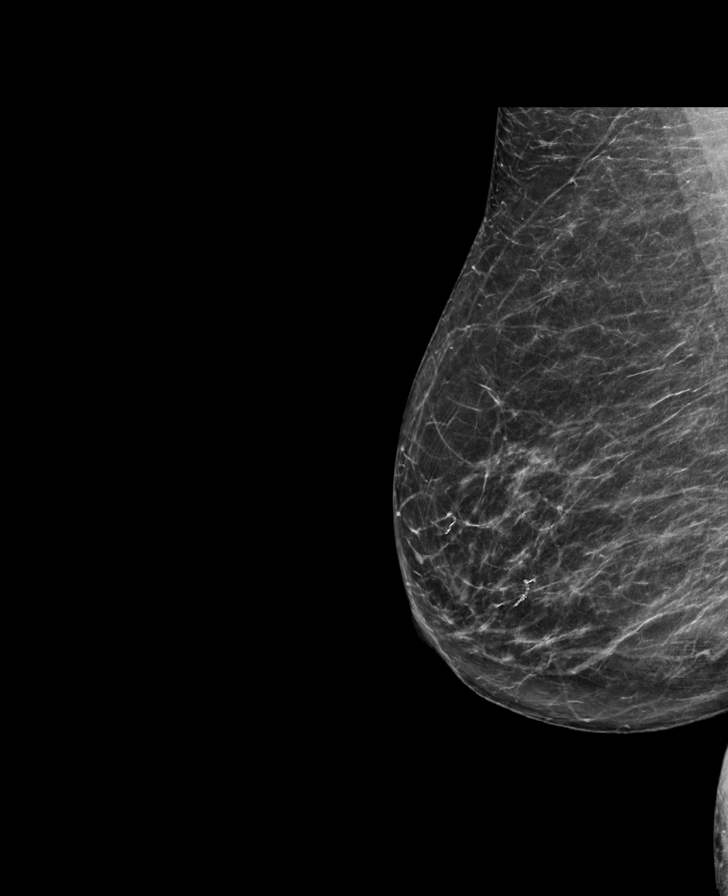

[L MLO synth-2D]
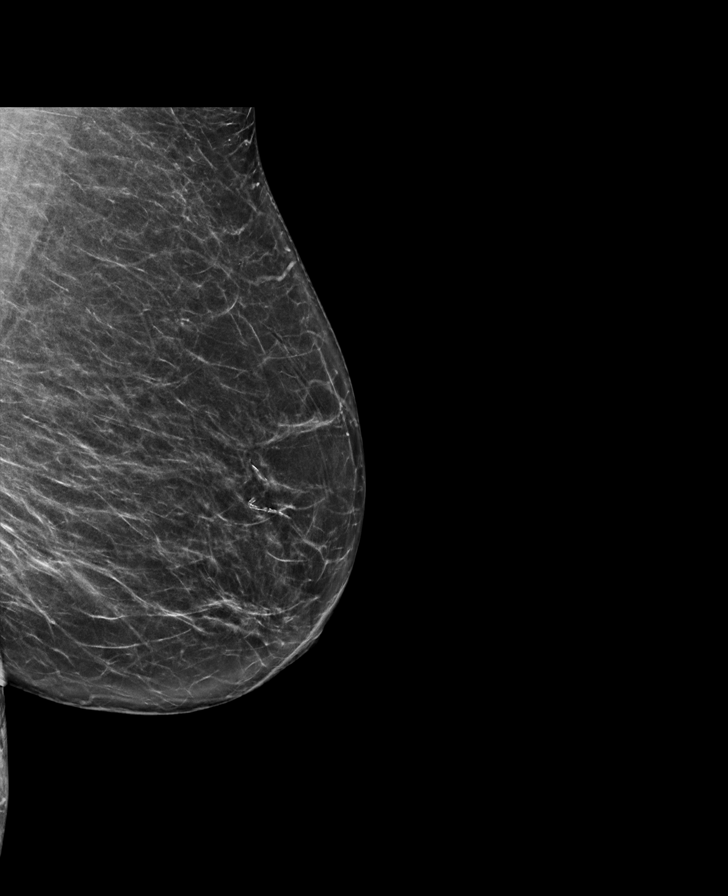

[L CC synth-2D]
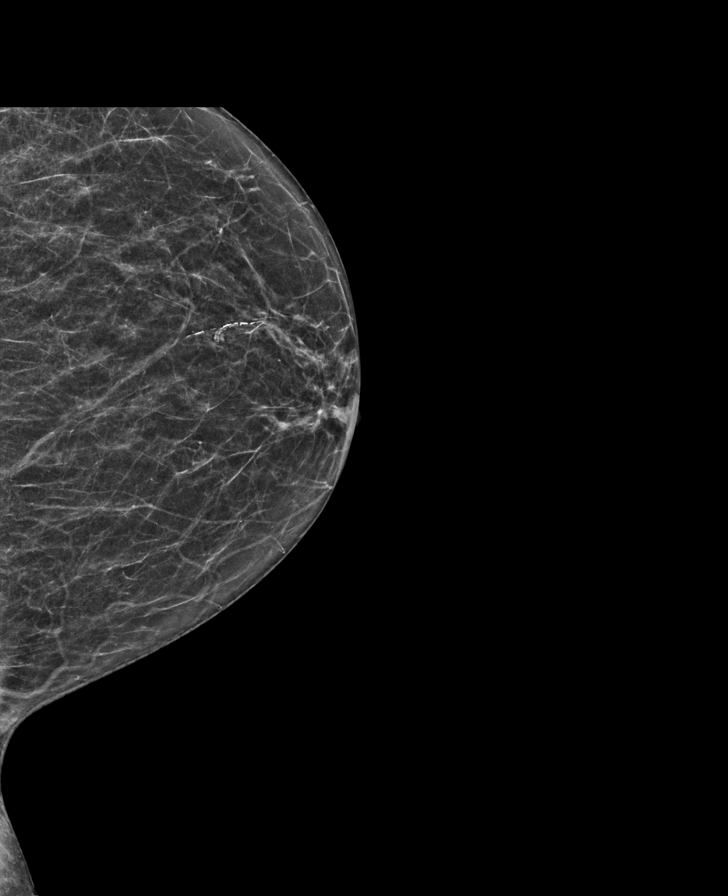

[R CC synth-2D]
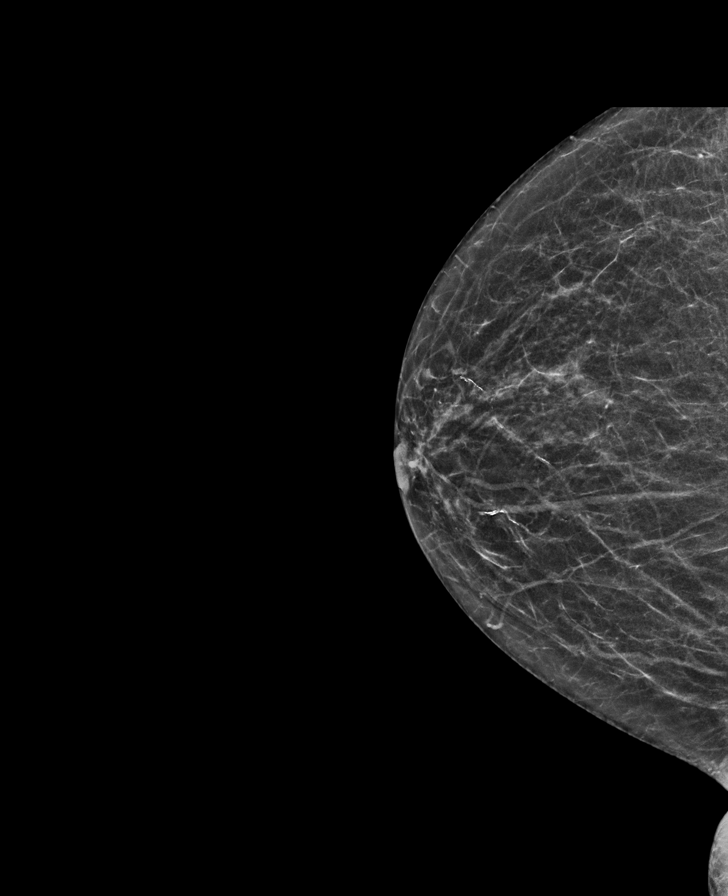

[R MLO tomo · tomo slice 35/70.0]
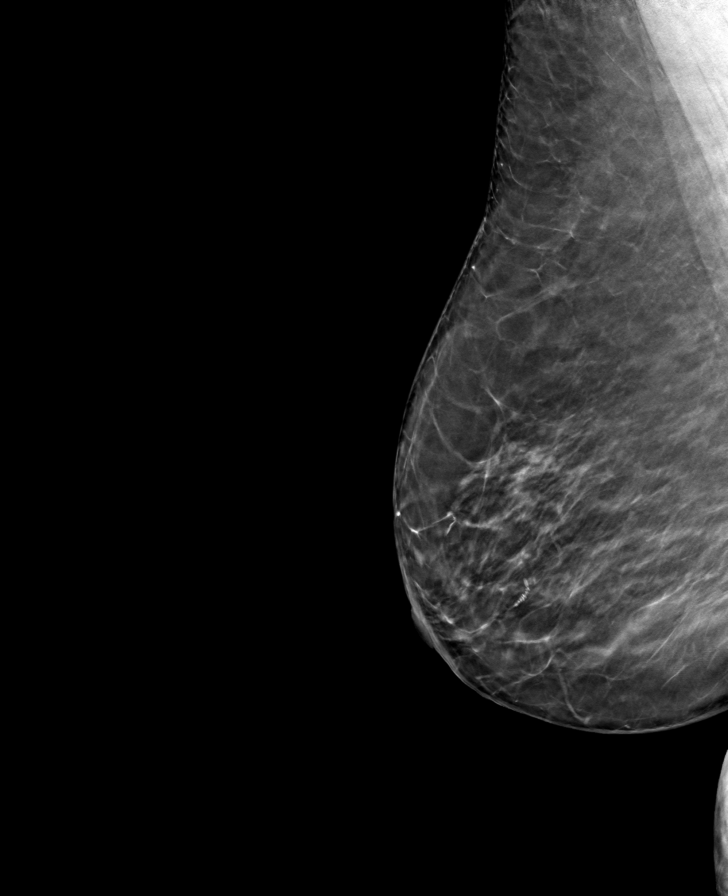

[L CC tomo · tomo slice 27/54.0]
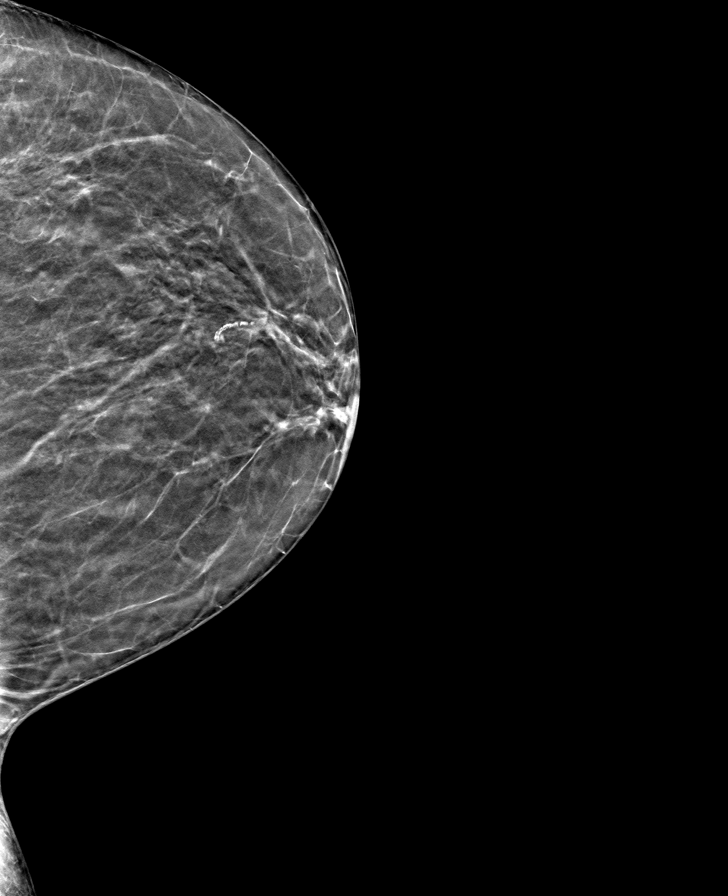

[R CC tomo · tomo slice 27/54.0]
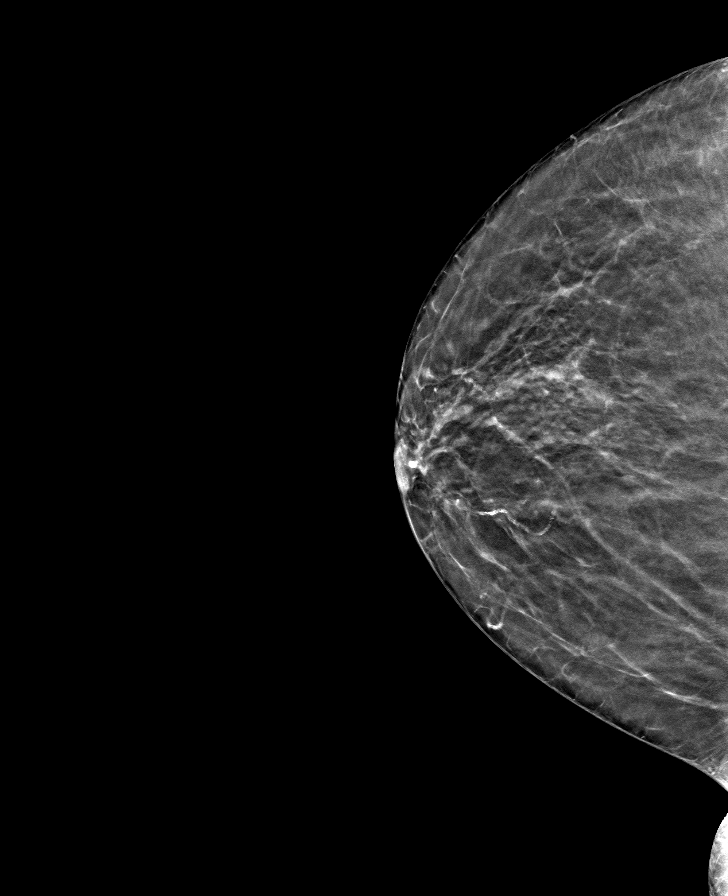

[L MLO tomo · tomo slice 36/71.0]
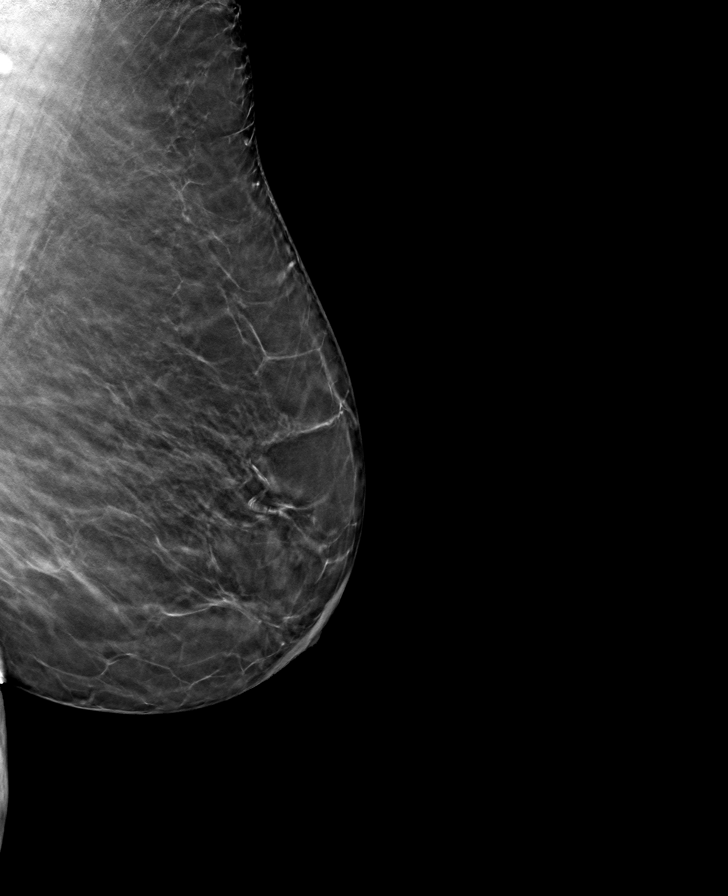

[8 of 24 positions shown; findings below may reference images not displayed]

ACR Breast Density Category b: There are scattered areas of
fibroglandular density.
FINDINGS: There are no findings suspicious for malignancy. Images were
processed with CAD.
IMPRESSION: No mammographic evidence of malignancy. A result letter of this
screening mammogram will be mailed directly to the patient.

RECOMMENDATION:
Screening mammogram in one year. (Code:CN-U-775)

BI-RADS CATEGORY  1: Negative.

## 2020-08-24 DIAGNOSIS — H04123 Dry eye syndrome of bilateral lacrimal glands: Secondary | ICD-10-CM | POA: Diagnosis not present

## 2020-08-24 DIAGNOSIS — H43813 Vitreous degeneration, bilateral: Secondary | ICD-10-CM | POA: Diagnosis not present

## 2020-08-24 DIAGNOSIS — H2513 Age-related nuclear cataract, bilateral: Secondary | ICD-10-CM | POA: Diagnosis not present

## 2020-08-24 DIAGNOSIS — H401132 Primary open-angle glaucoma, bilateral, moderate stage: Secondary | ICD-10-CM | POA: Diagnosis not present

## 2020-10-13 DIAGNOSIS — E7800 Pure hypercholesterolemia, unspecified: Secondary | ICD-10-CM | POA: Diagnosis not present

## 2020-10-13 DIAGNOSIS — Z7984 Long term (current) use of oral hypoglycemic drugs: Secondary | ICD-10-CM | POA: Diagnosis not present

## 2020-10-13 DIAGNOSIS — I10 Essential (primary) hypertension: Secondary | ICD-10-CM | POA: Diagnosis not present

## 2020-10-13 DIAGNOSIS — E11319 Type 2 diabetes mellitus with unspecified diabetic retinopathy without macular edema: Secondary | ICD-10-CM | POA: Diagnosis not present

## 2020-12-28 DIAGNOSIS — H401132 Primary open-angle glaucoma, bilateral, moderate stage: Secondary | ICD-10-CM | POA: Diagnosis not present

## 2021-03-17 DIAGNOSIS — Z23 Encounter for immunization: Secondary | ICD-10-CM | POA: Diagnosis not present

## 2021-05-13 DIAGNOSIS — H401132 Primary open-angle glaucoma, bilateral, moderate stage: Secondary | ICD-10-CM | POA: Diagnosis not present

## 2021-05-13 DIAGNOSIS — E113292 Type 2 diabetes mellitus with mild nonproliferative diabetic retinopathy without macular edema, left eye: Secondary | ICD-10-CM | POA: Diagnosis not present

## 2021-05-13 DIAGNOSIS — H35033 Hypertensive retinopathy, bilateral: Secondary | ICD-10-CM | POA: Diagnosis not present

## 2021-05-13 DIAGNOSIS — E113291 Type 2 diabetes mellitus with mild nonproliferative diabetic retinopathy without macular edema, right eye: Secondary | ICD-10-CM | POA: Diagnosis not present

## 2021-05-23 DIAGNOSIS — E7800 Pure hypercholesterolemia, unspecified: Secondary | ICD-10-CM | POA: Diagnosis not present

## 2021-05-23 DIAGNOSIS — Z79899 Other long term (current) drug therapy: Secondary | ICD-10-CM | POA: Diagnosis not present

## 2021-05-23 DIAGNOSIS — Z0000 Encounter for general adult medical examination without abnormal findings: Secondary | ICD-10-CM | POA: Diagnosis not present

## 2021-05-23 DIAGNOSIS — Z1211 Encounter for screening for malignant neoplasm of colon: Secondary | ICD-10-CM | POA: Diagnosis not present

## 2021-05-23 DIAGNOSIS — Z5181 Encounter for therapeutic drug level monitoring: Secondary | ICD-10-CM | POA: Diagnosis not present

## 2021-05-23 DIAGNOSIS — I10 Essential (primary) hypertension: Secondary | ICD-10-CM | POA: Diagnosis not present

## 2021-05-23 DIAGNOSIS — E11319 Type 2 diabetes mellitus with unspecified diabetic retinopathy without macular edema: Secondary | ICD-10-CM | POA: Diagnosis not present

## 2021-05-23 DIAGNOSIS — Z7984 Long term (current) use of oral hypoglycemic drugs: Secondary | ICD-10-CM | POA: Diagnosis not present

## 2021-08-26 DIAGNOSIS — M25511 Pain in right shoulder: Secondary | ICD-10-CM | POA: Diagnosis not present

## 2021-11-11 DIAGNOSIS — E113292 Type 2 diabetes mellitus with mild nonproliferative diabetic retinopathy without macular edema, left eye: Secondary | ICD-10-CM | POA: Diagnosis not present

## 2021-11-11 DIAGNOSIS — H04123 Dry eye syndrome of bilateral lacrimal glands: Secondary | ICD-10-CM | POA: Diagnosis not present

## 2021-11-11 DIAGNOSIS — H401132 Primary open-angle glaucoma, bilateral, moderate stage: Secondary | ICD-10-CM | POA: Diagnosis not present

## 2021-11-11 DIAGNOSIS — H43813 Vitreous degeneration, bilateral: Secondary | ICD-10-CM | POA: Diagnosis not present

## 2021-11-25 DIAGNOSIS — E7800 Pure hypercholesterolemia, unspecified: Secondary | ICD-10-CM | POA: Diagnosis not present

## 2021-11-25 DIAGNOSIS — I10 Essential (primary) hypertension: Secondary | ICD-10-CM | POA: Diagnosis not present

## 2021-11-25 DIAGNOSIS — E11319 Type 2 diabetes mellitus with unspecified diabetic retinopathy without macular edema: Secondary | ICD-10-CM | POA: Diagnosis not present

## 2022-05-23 DIAGNOSIS — E113292 Type 2 diabetes mellitus with mild nonproliferative diabetic retinopathy without macular edema, left eye: Secondary | ICD-10-CM | POA: Diagnosis not present

## 2022-05-23 DIAGNOSIS — H35371 Puckering of macula, right eye: Secondary | ICD-10-CM | POA: Diagnosis not present

## 2022-05-23 DIAGNOSIS — E113291 Type 2 diabetes mellitus with mild nonproliferative diabetic retinopathy without macular edema, right eye: Secondary | ICD-10-CM | POA: Diagnosis not present

## 2022-05-23 DIAGNOSIS — H401132 Primary open-angle glaucoma, bilateral, moderate stage: Secondary | ICD-10-CM | POA: Diagnosis not present

## 2022-07-17 DIAGNOSIS — E11319 Type 2 diabetes mellitus with unspecified diabetic retinopathy without macular edema: Secondary | ICD-10-CM | POA: Diagnosis not present

## 2022-07-17 DIAGNOSIS — E1329 Other specified diabetes mellitus with other diabetic kidney complication: Secondary | ICD-10-CM | POA: Diagnosis not present

## 2022-07-17 DIAGNOSIS — E7800 Pure hypercholesterolemia, unspecified: Secondary | ICD-10-CM | POA: Diagnosis not present

## 2022-07-17 DIAGNOSIS — N1831 Chronic kidney disease, stage 3a: Secondary | ICD-10-CM | POA: Diagnosis not present

## 2022-07-17 DIAGNOSIS — Z0000 Encounter for general adult medical examination without abnormal findings: Secondary | ICD-10-CM | POA: Diagnosis not present

## 2022-07-17 DIAGNOSIS — I10 Essential (primary) hypertension: Secondary | ICD-10-CM | POA: Diagnosis not present

## 2022-07-17 DIAGNOSIS — E1122 Type 2 diabetes mellitus with diabetic chronic kidney disease: Secondary | ICD-10-CM | POA: Diagnosis not present

## 2022-07-17 DIAGNOSIS — Z1389 Encounter for screening for other disorder: Secondary | ICD-10-CM | POA: Diagnosis not present

## 2023-01-03 DIAGNOSIS — E113292 Type 2 diabetes mellitus with mild nonproliferative diabetic retinopathy without macular edema, left eye: Secondary | ICD-10-CM | POA: Diagnosis not present

## 2023-01-03 DIAGNOSIS — H2511 Age-related nuclear cataract, right eye: Secondary | ICD-10-CM | POA: Diagnosis not present

## 2023-01-03 DIAGNOSIS — E113291 Type 2 diabetes mellitus with mild nonproliferative diabetic retinopathy without macular edema, right eye: Secondary | ICD-10-CM | POA: Diagnosis not present

## 2023-01-03 DIAGNOSIS — H35033 Hypertensive retinopathy, bilateral: Secondary | ICD-10-CM | POA: Diagnosis not present

## 2023-01-03 DIAGNOSIS — H401132 Primary open-angle glaucoma, bilateral, moderate stage: Secondary | ICD-10-CM | POA: Diagnosis not present

## 2023-01-16 DIAGNOSIS — E7800 Pure hypercholesterolemia, unspecified: Secondary | ICD-10-CM | POA: Diagnosis not present

## 2023-01-16 DIAGNOSIS — E11319 Type 2 diabetes mellitus with unspecified diabetic retinopathy without macular edema: Secondary | ICD-10-CM | POA: Diagnosis not present

## 2023-01-16 DIAGNOSIS — E1122 Type 2 diabetes mellitus with diabetic chronic kidney disease: Secondary | ICD-10-CM | POA: Diagnosis not present

## 2023-01-16 DIAGNOSIS — I10 Essential (primary) hypertension: Secondary | ICD-10-CM | POA: Diagnosis not present

## 2023-01-16 DIAGNOSIS — N1831 Chronic kidney disease, stage 3a: Secondary | ICD-10-CM | POA: Diagnosis not present

## 2023-07-18 DIAGNOSIS — H2511 Age-related nuclear cataract, right eye: Secondary | ICD-10-CM | POA: Diagnosis not present

## 2023-07-18 DIAGNOSIS — E113291 Type 2 diabetes mellitus with mild nonproliferative diabetic retinopathy without macular edema, right eye: Secondary | ICD-10-CM | POA: Diagnosis not present

## 2023-07-18 DIAGNOSIS — H401132 Primary open-angle glaucoma, bilateral, moderate stage: Secondary | ICD-10-CM | POA: Diagnosis not present

## 2023-07-18 DIAGNOSIS — E113392 Type 2 diabetes mellitus with moderate nonproliferative diabetic retinopathy without macular edema, left eye: Secondary | ICD-10-CM | POA: Diagnosis not present

## 2023-09-06 DIAGNOSIS — N1831 Chronic kidney disease, stage 3a: Secondary | ICD-10-CM | POA: Diagnosis not present

## 2023-09-06 DIAGNOSIS — Z23 Encounter for immunization: Secondary | ICD-10-CM | POA: Diagnosis not present

## 2023-09-06 DIAGNOSIS — E11319 Type 2 diabetes mellitus with unspecified diabetic retinopathy without macular edema: Secondary | ICD-10-CM | POA: Diagnosis not present

## 2023-09-06 DIAGNOSIS — Z0000 Encounter for general adult medical examination without abnormal findings: Secondary | ICD-10-CM | POA: Diagnosis not present

## 2023-09-06 DIAGNOSIS — I10 Essential (primary) hypertension: Secondary | ICD-10-CM | POA: Diagnosis not present

## 2023-09-06 DIAGNOSIS — E7800 Pure hypercholesterolemia, unspecified: Secondary | ICD-10-CM | POA: Diagnosis not present

## 2024-03-11 DIAGNOSIS — N1831 Chronic kidney disease, stage 3a: Secondary | ICD-10-CM | POA: Diagnosis not present

## 2024-03-11 DIAGNOSIS — I10 Essential (primary) hypertension: Secondary | ICD-10-CM | POA: Diagnosis not present

## 2024-03-11 DIAGNOSIS — M25562 Pain in left knee: Secondary | ICD-10-CM | POA: Diagnosis not present

## 2024-03-11 DIAGNOSIS — E7800 Pure hypercholesterolemia, unspecified: Secondary | ICD-10-CM | POA: Diagnosis not present

## 2024-03-11 DIAGNOSIS — E11319 Type 2 diabetes mellitus with unspecified diabetic retinopathy without macular edema: Secondary | ICD-10-CM | POA: Diagnosis not present

## 2024-03-11 DIAGNOSIS — Z23 Encounter for immunization: Secondary | ICD-10-CM | POA: Diagnosis not present

## 2024-04-07 DIAGNOSIS — E113392 Type 2 diabetes mellitus with moderate nonproliferative diabetic retinopathy without macular edema, left eye: Secondary | ICD-10-CM | POA: Diagnosis not present

## 2024-04-07 DIAGNOSIS — H35371 Puckering of macula, right eye: Secondary | ICD-10-CM | POA: Diagnosis not present

## 2024-04-07 DIAGNOSIS — E113291 Type 2 diabetes mellitus with mild nonproliferative diabetic retinopathy without macular edema, right eye: Secondary | ICD-10-CM | POA: Diagnosis not present

## 2024-04-07 DIAGNOSIS — H401132 Primary open-angle glaucoma, bilateral, moderate stage: Secondary | ICD-10-CM | POA: Diagnosis not present
